# Patient Record
Sex: Female | Born: 1980 | Race: White | Hispanic: No | Marital: Married | Smoking: Current every day smoker
Health system: Southern US, Community
[De-identification: ages and names within clinical notes are randomized; demographics above are authoritative.]

## PROBLEM LIST (undated history)

## (undated) DIAGNOSIS — R21 Rash and other nonspecific skin eruption: Secondary | ICD-10-CM

## (undated) HISTORY — PX: CHOLECYSTECTOMY: SHX55

---

## 2009-10-16 ENCOUNTER — Emergency Department (HOSPITAL_COMMUNITY): Admission: EM | Admit: 2009-10-16 | Discharge: 2009-10-17 | Payer: Self-pay | Admitting: Emergency Medicine

## 2010-11-30 ENCOUNTER — Emergency Department (HOSPITAL_COMMUNITY)
Admission: EM | Admit: 2010-11-30 | Discharge: 2010-11-30 | Disposition: A | Discharge status: Home / Self Care | Payer: Self-pay | Attending: Emergency Medicine | Admitting: Emergency Medicine

## 2010-11-30 ENCOUNTER — Emergency Department (HOSPITAL_COMMUNITY): Payer: Self-pay

## 2010-11-30 DIAGNOSIS — M7989 Other specified soft tissue disorders: Secondary | ICD-10-CM | POA: Insufficient documentation

## 2010-11-30 DIAGNOSIS — M79609 Pain in unspecified limb: Secondary | ICD-10-CM | POA: Insufficient documentation

## 2010-11-30 DIAGNOSIS — L299 Pruritus, unspecified: Secondary | ICD-10-CM | POA: Insufficient documentation

## 2010-12-06 ENCOUNTER — Emergency Department (HOSPITAL_COMMUNITY)
Admission: EM | Admit: 2010-12-06 | Discharge: 2010-12-06 | Disposition: A | Discharge status: Home / Self Care | Payer: Self-pay | Attending: Emergency Medicine | Admitting: Emergency Medicine

## 2010-12-06 DIAGNOSIS — G43909 Migraine, unspecified, not intractable, without status migrainosus: Secondary | ICD-10-CM | POA: Insufficient documentation

## 2010-12-06 DIAGNOSIS — Z8639 Personal history of other endocrine, nutritional and metabolic disease: Secondary | ICD-10-CM | POA: Insufficient documentation

## 2010-12-06 DIAGNOSIS — R109 Unspecified abdominal pain: Secondary | ICD-10-CM | POA: Insufficient documentation

## 2010-12-06 DIAGNOSIS — Z862 Personal history of diseases of the blood and blood-forming organs and certain disorders involving the immune mechanism: Secondary | ICD-10-CM | POA: Insufficient documentation

## 2010-12-06 DIAGNOSIS — R112 Nausea with vomiting, unspecified: Secondary | ICD-10-CM | POA: Insufficient documentation

## 2010-12-06 LAB — URINALYSIS, ROUTINE W REFLEX MICROSCOPIC
Ketones, ur: NEGATIVE mg/dL
Nitrite: NEGATIVE
Protein, ur: NEGATIVE mg/dL

## 2010-12-06 LAB — COMPREHENSIVE METABOLIC PANEL
Alkaline Phosphatase: 42 U/L (ref 39–117)
BUN: 12 mg/dL (ref 6–23)
Chloride: 108 mEq/L (ref 96–112)
Creatinine, Ser: 0.87 mg/dL (ref 0.4–1.2)
Glucose, Bld: 90 mg/dL (ref 70–99)
Potassium: 3.5 mEq/L (ref 3.5–5.1)
Total Bilirubin: 0.4 mg/dL (ref 0.3–1.2)
Total Protein: 5.9 g/dL — ABNORMAL LOW (ref 6.0–8.3)

## 2010-12-06 LAB — PREGNANCY, URINE: Preg Test, Ur: NEGATIVE

## 2012-03-29 ENCOUNTER — Encounter (HOSPITAL_COMMUNITY): Payer: Self-pay | Admitting: *Deleted

## 2012-03-29 ENCOUNTER — Emergency Department (HOSPITAL_COMMUNITY)
Admission: EM | Admit: 2012-03-29 | Discharge: 2012-03-29 | Disposition: A | Discharge status: Home / Self Care | Payer: Self-pay | Attending: Emergency Medicine | Admitting: Emergency Medicine

## 2012-03-29 DIAGNOSIS — B354 Tinea corporis: Secondary | ICD-10-CM

## 2012-03-29 HISTORY — DX: Rash and other nonspecific skin eruption: R21

## 2012-03-29 MED ORDER — GRISEOFULVIN MICROSIZE 500 MG PO TABS: 500.0000 mg | ORAL_TABLET | Freq: Every day | ORAL | Status: DC

## 2012-03-29 NOTE — ED Provider Notes (Signed)
History   This chart was scribed for Gavin Pound. Nathaniel Yaden, MD scribed by Magnus Sinning. The patient was seen in room TR04C/TR04C seen at 16:01   CSN: [id number]  Arrival date & time 03/29/12  1537   First MD Initiated Contact with Patient 03/29/12 1553      Chief Complaint  Patient presents with  . Rash    (Consider location/radiation/quality/duration/timing/severity/associated sxs/prior treatment) HPI Laurie Khan is a 31 y.o. female who presents to the Emergency Department complaining of constant moderate discrete areas of red raised rash located on her chin, behind eyebrow, under breasts, ear, and inner right thigh, onset 7 days. Explains rash located under her both breasts is different from hx of skin infection that the area is now burning and pruritic,and not resolved by steroid cream.She explains she had another rash episode recently and states that it healed with steroid cream. She states it peeled, but returned with the burning itchy rash. She explains her children have had similar rash, but only appeared on daughter's cheeks. Reports she's been using steroid cream with no relief. Denies recent hotel stay, hot tub use, camping, or any pets in her home. Does report recent beach travel much earlier in the month. Hx of eczema  Past Medical History  Diagnosis Date  . Skin rash     under breast ,leg and face    Past Surgical History  Procedure Date  . Appendectomy     History reviewed. No pertinent family history.  History  Substance Use Topics  . Smoking status: Never Smoker   . Smokeless tobacco: Never Used  . Alcohol Use: No    OB History    Grav Para Term Preterm Abortions TAB SAB Ect Mult Living                  Review of Systems  Constitutional: Negative for fever and chills.  Respiratory: Negative for shortness of breath.   Gastrointestinal: Negative for nausea and vomiting.  Skin: Positive for rash. Negative for color change and wound.    Allergies    Review of patient's allergies indicates no known allergies.  Home Medications   Current Outpatient Rx  Name Route Sig Dispense Refill  . GRISEOFULVIN MICROSIZE 500 MG PO TABS Oral Take 1 tablet (500 mg total) by mouth daily. 21 tablet 0    BP 114/98  Pulse 85  Temp 98.6 F (37 C) (Oral)  Resp 21  SpO2 99%  Physical Exam  Nursing note and vitals reviewed. Constitutional: She is oriented to person, place, and time. She appears well-developed and well-nourished. No distress.  HENT:  Head: Normocephalic and atraumatic.  Eyes: EOM are normal.  Neck: Neck supple. No tracheal deviation present.  Cardiovascular: Normal rate.   Pulmonary/Chest: Effort normal. No respiratory distress.  Musculoskeletal: Normal range of motion.  Neurological: She is alert and oriented to person, place, and time.  Skin: Skin is warm and dry. Rash noted. No abrasion, no burn and no ecchymosis noted. Rash is papular. There is erythema. No pallor.       Discrete raise,d pruritic lesions that are papular on inner right thigh, both sides of neck, discrete areas on low back, arms, ankles.  Pt also has patchy erythematous raised pruritic and burning sensation underneath breasts, involving underneath breasts and anterior chest wall.  No drainage, no fluctuance, no induration.    Psychiatric: She has a normal mood and affect. Her behavior is normal.    ED Course  Procedures (including critical  care time) DIAGNOSTIC STUDIES: Oxygen Saturation is 99% on room air, normal by my interpretation.    COORDINATION OF CARE:   Labs Reviewed - No data to display No results found.   1. Tinea corporis       MDM  I personally performed the services described in this documentation, which was scribed in my presence. The recorded information has been reviewed and considered.        Gavin Pound. Oletta Lamas, MD 03/29/12 1610

## 2012-03-29 NOTE — Discharge Instructions (Signed)
Fungus Infection of the Skin An infection of your skin caused by a fungus is a very common problem. Treatment depends on which part of the body is affected. Types of fungal skin infection include:  Athlete's Foot(Tinea pedis). This infection starts between the toes and may involve the entire sole and sides of foot. It is the most common fungal disease. It is made worse by heat, moisture, and friction. To treat, wash your feet 2 to 3 times daily. Dry thoroughly between the toes. Use medicated foot powder or cream as directed on the package. Plain talc, cornstarch, or rice powder may be dusted into socks and shoes to keep the feet dry. Wearing footwear that allows ventilation is also helpful.   Ringworm (Tinea corporis and tinea capitis). This infection causes scaly red rings to form on the skin or scalp. For skin sores, apply medicated lotion or cream as directed on the package. For the scalp, medicated shampoo may be used with with other therapies. Ringworm of the scalp or fingernails usually requires using oral medicine for 2 to 4 months.   Tinea versicolor. This infection appears as painless, scaly, patchy areas of discolored skin (whitish to light [place]). It is more common in the summer and favors oily areas of the skin such as those found at the chest, abdomen, back, pubis, neck, and body folds. It can be treated with medicated shampoo or with medicated topical cream. Oral antifungals may be needed for more active infections. The light and/or dark spots may take time to get better and is not a sign of treatment failure.  Fungal infections may need to be treated for several weeks to be cured. It is important not to treat fungal infections with steroids or combination medicine that contains an antifungal and steroid as these will make the fungal infection worse. SEEK MEDICAL CARE IF:   You have persistent itching or rawness.   You have an oral temperature above 102 F (38.9 C).  Document Released:  10/24/2004 Document Revised: 09/05/2011 Document Reviewed: 01/09/2010 [employer] Patient Information 2012 [employer]. 

## 2012-03-29 NOTE — ED Notes (Signed)
PT reports a red raised rash on upper RT thigh and under each breast. Pt does report a Hx of yeast skin infections.

## 2012-04-02 ENCOUNTER — Emergency Department (HOSPITAL_COMMUNITY)
Admission: EM | Admit: 2012-04-02 | Discharge: 2012-04-02 | Disposition: A | Discharge status: Home / Self Care | Payer: Self-pay | Attending: Emergency Medicine | Admitting: Emergency Medicine

## 2012-04-02 ENCOUNTER — Encounter (HOSPITAL_COMMUNITY): Payer: Self-pay | Admitting: Emergency Medicine

## 2012-04-02 DIAGNOSIS — B86 Scabies: Secondary | ICD-10-CM

## 2012-04-02 DIAGNOSIS — R21 Rash and other nonspecific skin eruption: Secondary | ICD-10-CM | POA: Insufficient documentation

## 2012-04-02 MED ORDER — PERMETHRIN 5 % EX CREA: TOPICAL_CREAM | CUTANEOUS | Status: AC

## 2012-04-02 MED ORDER — BENADRYL 25 MG PO TABS: 25.0000 mg | ORAL_TABLET | Freq: Four times a day (QID) | ORAL | Status: DC | PRN

## 2012-04-02 NOTE — ED Notes (Signed)
PT. REPORTS PROGRESSING GENERALIZED ITCHY RASHES FOR SEVERAL WEEKS , STATES SEEN HERE LAST Sunday PRESCRIBED WITH DIFLUCAN WITH NO IMPROVEMENT.

## 2012-04-02 NOTE — ED Provider Notes (Signed)
History     CSN: [id number]  Arrival date & time 04/02/12  0442   First MD Initiated Contact with Patient 04/02/12 [phone]      Chief Complaint  Patient presents with  . Rash    (Consider location/radiation/quality/duration/timing/severity/associated sxs/prior treatment) Patient is a 31 y.o. female presenting with rash. The history is provided by the patient. No language interpreter was used.  Rash  This is a chronic problem. The current episode started more than 1 week ago. The problem has been gradually worsening. The problem is associated with an unknown factor. There has been no fever. The rash is present on the torso, face, left fingers and right fingers. The pain is at a severity of 3/10. The pain is mild. The pain has been constant since onset. Associated symptoms include itching and pain. She has tried anti-itch cream for the symptoms. Improvement on treatment: griseofulvin 500mg  po.  Pruritic Rash since 6/23.  Second visit to the ER.  She was treated with griseofulvin and diflucan po with no relief.  Husband and baby also have the rash but not as bad.  Red raised  and linear in nature to some areas especially between fingers. No fluctuance or infection/cellulitis noted.      Past Medical History  Diagnosis Date  . Skin rash     under breast ,leg and face    Past Surgical History  Procedure Date  . Appendectomy     No family history on file.  History  Substance Use Topics  . Smoking status: Never Smoker   . Smokeless tobacco: Never Used  . Alcohol Use: No    OB History    Grav Para Term Preterm Abortions TAB SAB Ect Mult Living                  Review of Systems  Constitutional: Negative.   HENT: Negative.  Negative for sore throat and neck stiffness.   Eyes: Negative.   Respiratory: Negative.  Negative for shortness of breath.   Cardiovascular: Negative.   Gastrointestinal: Negative.  Negative for nausea and vomiting.  Genitourinary: Negative for hematuria.   Skin: Positive for itching and rash.  Neurological: Negative.   Psychiatric/Behavioral: Negative.   All other systems reviewed and are negative.    Allergies  Review of patient's allergies indicates no known allergies.  Home Medications  No current outpatient prescriptions on file.  BP 122/72  Pulse 87  Temp 98.4 F (36.9 C) (Oral)  Resp 14  SpO2 99%  Physical Exam  Nursing note and vitals reviewed. Constitutional: She is oriented to person, place, and time. She appears well-developed and well-nourished.  HENT:  Head: Normocephalic and atraumatic.  Eyes: Conjunctivae and EOM are normal. Pupils are equal, round, and reactive to light.  Neck: Normal range of motion. Neck supple.  Cardiovascular: Normal rate.   Pulmonary/Chest: Effort normal.  Abdominal: Soft.  Musculoskeletal: Normal range of motion. She exhibits no edema and no tenderness.  Neurological: She is alert and oriented to person, place, and time. She has normal reflexes.  Skin: Skin is warm and dry. Rash noted.  Psychiatric: She has a normal mood and affect.    ED Course  Procedures (including critical care time)  Labs Reviewed - No data to display No results found.   No diagnosis found.    MDM  Red raised pruritic  linear rash.  Treated with griseofulvin and diflucan recently with no relief.  Will treat for scabies today because linear in nature  and the familiy has it too. Husband has been staying in motels with travel. Could be bed bugs.  Benadryl for itching.  No infection noted.  Will give dermatology referral if not better to Dr. Margo Aye.           Remi Haggard, NP 04/03/12 2152

## 2012-04-02 NOTE — ED Notes (Signed)
PT. SEEN AND EXAMINED BY A. CRAWFORD NP.

## 2012-04-13 NOTE — ED Provider Notes (Signed)
History/physical exam/procedure(s) were performed by non-physician practitioner and as supervising physician I was immediately available for consultation/collaboration. I have reviewed all notes and am in agreement with care and plan.   Jadon Ressler S Kylyn Sookram, MD 04/13/12 0718 

## 2012-08-11 ENCOUNTER — Emergency Department (HOSPITAL_COMMUNITY)
Admission: EM | Admit: 2012-08-11 | Discharge: 2012-08-12 | Disposition: A | Discharge status: Home / Self Care | Payer: Self-pay | Attending: Emergency Medicine | Admitting: Emergency Medicine

## 2012-08-11 ENCOUNTER — Encounter (HOSPITAL_COMMUNITY): Payer: Self-pay | Admitting: Emergency Medicine

## 2012-08-11 DIAGNOSIS — F17200 Nicotine dependence, unspecified, uncomplicated: Secondary | ICD-10-CM | POA: Insufficient documentation

## 2012-08-11 DIAGNOSIS — R109 Unspecified abdominal pain: Secondary | ICD-10-CM

## 2012-08-11 DIAGNOSIS — R110 Nausea: Secondary | ICD-10-CM | POA: Insufficient documentation

## 2012-08-11 DIAGNOSIS — R1031 Right lower quadrant pain: Secondary | ICD-10-CM | POA: Insufficient documentation

## 2012-08-11 LAB — WET PREP, GENITAL
Trich, Wet Prep: NONE SEEN
Yeast Wet Prep HPF POC: NONE SEEN

## 2012-08-11 LAB — CBC WITH DIFFERENTIAL/PLATELET
Basophils Relative: 1 % (ref 0–1)
Eosinophils Absolute: 0.2 10*3/uL (ref 0.0–0.7)
HCT: 40.9 % (ref 36.0–46.0)
Hemoglobin: 14.2 g/dL (ref 12.0–15.0)
MCH: 30 pg (ref 26.0–34.0)
MCHC: 34.7 g/dL (ref 30.0–36.0)
MCV: 86.3 fL (ref 78.0–100.0)
Monocytes Absolute: 0.5 10*3/uL (ref 0.1–1.0)
Monocytes Relative: 8 % (ref 3–12)

## 2012-08-11 LAB — URINALYSIS, ROUTINE W REFLEX MICROSCOPIC
Bilirubin Urine: NEGATIVE
Hgb urine dipstick: NEGATIVE
Specific Gravity, Urine: 1.021 (ref 1.005–1.030)
Urobilinogen, UA: 0.2 mg/dL (ref 0.0–1.0)

## 2012-08-11 LAB — URINE MICROSCOPIC-ADD ON

## 2012-08-11 LAB — BASIC METABOLIC PANEL
BUN: 7 mg/dL (ref 6–23)
Chloride: 103 mEq/L (ref 96–112)
Creatinine, Ser: 0.82 mg/dL (ref 0.50–1.10)
GFR calc Af Amer: >90 mL/min (ref >90)
GFR calc non Af Amer: >90 mL/min (ref >90)

## 2012-08-11 MED ORDER — IOHEXOL 300 MG/ML  SOLN
20.0000 mL | INTRAMUSCULAR | Status: AC
  Administered 2012-08-11: 20 mL via ORAL

## 2012-08-11 NOTE — ED Provider Notes (Signed)
History     CSN: [id number]  Arrival date & time 08/11/12  1500   First MD Initiated Contact with Patient 08/11/12 1939      Chief Complaint  Patient presents with  . Abdominal Pain  . Nausea    (Consider location/radiation/quality/duration/timing/severity/associated sxs/prior treatment) HPI Comments: Patient presents today with a chief complaint of abdominal pain.  Pain predominantly located in the RLQ.  She reports that she began having generalized abdominal pain yesterday, but today reports that it is predominantly in the RLQ.  She reports that she has never had pain like this before.  Pain came on gradually and is gradually worsening.  She has not taken anything for pain.  She denies fever.  She has had chills  She had nausea yesterday, but nausea is controlled at this time.  No vomiting.  Denies diarrhea.  Denies constipation.  Denies vaginal discharge or abnormal vaginal bleeding.  She denies increased urinary frequency, urgency, or dysuria.    The history is provided by the patient.    Past Medical History  Diagnosis Date  . Skin rash     under breast ,leg and face    Past Surgical History  Procedure Date  . Cholecystectomy     History reviewed. No pertinent family history.  History  Substance Use Topics  . Smoking status: Current Every Day Smoker  . Smokeless tobacco: Never Used  . Alcohol Use: Yes     Comment: occ    OB History    Grav Para Term Preterm Abortions TAB SAB Ect Mult Living                  Review of Systems  Constitutional: Positive for chills and appetite change. Negative for fever.  Gastrointestinal: Positive for nausea and abdominal pain. Negative for vomiting, diarrhea, constipation, blood in stool and abdominal distention.  Genitourinary: Negative for dysuria, urgency, frequency, vaginal bleeding and vaginal discharge.    Allergies  Review of patient's allergies indicates no known allergies.  Home Medications  No current  outpatient prescriptions on file.  BP 110/66  Pulse 71  Temp 97.9 F (36.6 C) (Oral)  Resp 16  SpO2 100%  Physical Exam  Nursing note and vitals reviewed. Constitutional: She appears well-developed and well-nourished. No distress.  HENT:  Head: Normocephalic and atraumatic.  Mouth/Throat: Oropharynx is clear and moist.  Neck: Normal range of motion. Neck supple.  Cardiovascular: Normal rate, regular rhythm and normal heart sounds.   Pulmonary/Chest: Effort normal.  Abdominal: Soft. Bowel sounds are normal. She exhibits no distension and no mass. There is tenderness in the right lower quadrant. There is rebound and guarding.  Genitourinary: Cervix exhibits no motion tenderness. Right adnexum displays no mass, no tenderness and no fullness. Left adnexum displays no mass, no tenderness and no fullness.  Neurological: She is alert.  Skin: Skin is warm and dry. She is not diaphoretic.  Psychiatric: She has a normal mood and affect.    ED Course  Procedures (including critical care time)  Labs Reviewed  BASIC METABOLIC PANEL - Abnormal; Notable for the following:    Potassium 3.4 (*)     All other components within normal limits  URINALYSIS, ROUTINE W REFLEX MICROSCOPIC - Abnormal; Notable for the following:    APPearance HAZY (*)     Leukocytes, UA MODERATE (*)     All other components within normal limits  URINE MICROSCOPIC-ADD ON - Abnormal; Notable for the following:    Squamous Epithelial / LPF  FEW (*)     Bacteria, UA FEW (*)     All other components within normal limits  CBC WITH DIFFERENTIAL  LIPASE, BLOOD  POCT PREGNANCY, URINE  URINE CULTURE  GC/CHLAMYDIA PROBE AMP  WET PREP, GENITAL   No results found.   No diagnosis found.  12:37 AM Patient signed out to Arthor Captain, PA-C who assumes care of patient.  CT pending.  MDM  Patient presenting with a chief complaint of abdominal pain that has been present.  On abdominal exam the pain is localized to the RLQ.   Rebound and guarding present.  No adnexal tenderness on pelvic exam.  Therefore, CT ab/pelvis ordered to rule out Acute Appendicitis.  Arthor Captain, PA-C will follow up on the results of the CT.         Pascal Lux [place], PA-C 08/12/12 0139

## 2012-08-11 NOTE — ED Notes (Signed)
Pt

## 2012-08-11 NOTE — ED Notes (Addendum)
Pt reports abdominal pain, chills, nausea, body aches and cramping since approx 3pm yesterday. States sharp abdominal pain started today. Reports decreased appetite. Complains of diarrhea x 1 day. Denies pain recent change in diet.

## 2012-08-11 NOTE — ED Notes (Signed)
Pt completed oral contrast drink.

## 2012-08-11 NOTE — ED Notes (Signed)
Pt c/o right sided abd pain with nausea starting yesterday; pt sts episode with some chills with pain yesterday; pt sts tender to palpation

## 2012-08-12 ENCOUNTER — Emergency Department (HOSPITAL_COMMUNITY): Payer: Self-pay

## 2012-08-12 ENCOUNTER — Encounter (HOSPITAL_COMMUNITY): Payer: Self-pay | Admitting: Radiology

## 2012-08-12 MED ORDER — IOHEXOL 300 MG/ML  SOLN
100.0000 mL | Freq: Once | INTRAMUSCULAR | Status: AC | PRN
  Administered 2012-08-12: 100 mL via INTRAVENOUS

## 2012-08-12 MED ORDER — DICYCLOMINE HCL 20 MG PO TABS: 20.0000 mg | ORAL_TABLET | Freq: Two times a day (BID) | ORAL | Status: DC

## 2012-08-12 MED ORDER — METRONIDAZOLE 500 MG PO TABS: 500.0000 mg | ORAL_TABLET | Freq: Two times a day (BID) | ORAL | Status: DC

## 2012-08-12 NOTE — ED Notes (Signed)
Pt A.O. X 4. NAD. Ambulatory. Respirations even and regular. Vitals stable. Skin warm and dry. States pain is unchanged since arrival . Rates abdominal pain as 8/10. Denies N/V/D/C. Verbalized understanding of medication admin. No further questions at this time.

## 2012-08-12 NOTE — ED Provider Notes (Signed)
Medical screening examination/treatment/procedure(s) were performed by non-physician practitioner and as supervising physician I was immediately available for consultation/collaboration.   Laray Anger, DO 08/12/12 1325

## 2012-08-12 NOTE — ED Provider Notes (Signed)
Medical screening examination/treatment/procedure(s) were performed by non-physician practitioner and as supervising physician I was immediately available for consultation/collaboration.   Charles B. Sheldon, MD 08/12/12 1950 

## 2012-08-12 NOTE — ED Provider Notes (Signed)
12:45 AM Assumed care of the patient from [employer], PA-C. Patient Labs show BV.  Questionable UTI. Patient without ruinary sxs.Abdominal pain still present.  4/10.  Will d/c with bentyl and ob gyn fu. CV: RRR, No M/R/G, Peripheral pulses intact. No peripheral edema. Lungs: CTAB Abd: Soft,  Mild difuse tenderness., non distended   Discussed reasons to seek immediate care. Patient expresses understanding and agrees with plan.    Arthor Captain, PA-C 08/12/12 0120

## 2012-08-12 NOTE — ED Notes (Signed)
Pt returned from CT °

## 2012-08-12 NOTE — ED Notes (Signed)
Patient transported to CT 

## 2012-08-15 LAB — URINE CULTURE

## 2012-08-16 NOTE — ED Notes (Signed)
+   Urine Chart sent to EDP office for review. 

## 2012-08-18 NOTE — ED Notes (Signed)
Chart returned from EDP office rx for Levoquin 500 mg 1 day x 5 days written by A.Crawford need to be called to pharmacy  .

## 2013-03-04 ENCOUNTER — Encounter (HOSPITAL_COMMUNITY): Payer: Self-pay | Admitting: Emergency Medicine

## 2013-03-04 ENCOUNTER — Emergency Department (HOSPITAL_COMMUNITY)
Admission: EM | Admit: 2013-03-04 | Discharge: 2013-03-04 | Disposition: A | Discharge status: Home / Self Care | Payer: Self-pay | Attending: Emergency Medicine | Admitting: Emergency Medicine

## 2013-03-04 DIAGNOSIS — Z872 Personal history of diseases of the skin and subcutaneous tissue: Secondary | ICD-10-CM | POA: Insufficient documentation

## 2013-03-04 DIAGNOSIS — F17200 Nicotine dependence, unspecified, uncomplicated: Secondary | ICD-10-CM | POA: Insufficient documentation

## 2013-03-04 DIAGNOSIS — J40 Bronchitis, not specified as acute or chronic: Secondary | ICD-10-CM | POA: Insufficient documentation

## 2013-03-04 DIAGNOSIS — R062 Wheezing: Secondary | ICD-10-CM | POA: Insufficient documentation

## 2013-03-04 DIAGNOSIS — H6690 Otitis media, unspecified, unspecified ear: Secondary | ICD-10-CM | POA: Insufficient documentation

## 2013-03-04 MED ORDER — AMOXICILLIN 500 MG PO CAPS: 500.0000 mg | ORAL_CAPSULE | Freq: Three times a day (TID) | ORAL | Status: AC

## 2013-03-04 MED ORDER — ALBUTEROL SULFATE HFA 108 (90 BASE) MCG/ACT IN AERS
2.0000 | INHALATION_SPRAY | RESPIRATORY_TRACT | Status: DC | PRN
  Administered 2013-03-04: 2 via RESPIRATORY_TRACT
  Filled 2013-03-04: qty 6.7

## 2013-03-04 MED ORDER — BENZONATATE 100 MG PO CAPS: 100.0000 mg | ORAL_CAPSULE | Freq: Three times a day (TID) | ORAL | Status: AC

## 2013-03-04 NOTE — ED Provider Notes (Signed)
History    This chart was scribed for Rhea Bleacher, PA working with Shelda Jakes, MD by ED Scribe, Burman Nieves. This patient was seen in room TR11C/TR11C and the patient's care was started at 7:19 PM.   CSN: [id number]  Arrival date & time 03/04/13  1831   First MD Initiated Contact with Patient 03/04/13 1919      Chief Complaint  Patient presents with  . URI  . Otalgia    (Consider location/radiation/quality/duration/timing/severity/associated sxs/prior treatment) Patient is a 32 y.o. female presenting with URI and ear pain. The history is provided by the patient and a friend. No language interpreter was used.  URI Presenting symptoms: congestion, cough and ear pain   Presenting symptoms: no fever   Congestion:    Location:  Chest and nasal   Interferes with sleep: yes   Cough:    Cough characteristics:  Productive   Sputum characteristics:  Green   Severity:  Moderate   Timing:  Intermittent Severity:  Moderate Timing:  Constant Progression:  Unchanged Relieved by:  Nothing Associated symptoms: wheezing   Otalgia Associated symptoms: congestion and cough   Associated symptoms: no diarrhea, no fever and no vomiting    HPI Comments: Laurie Khan is a 32 y.o. female who presents to the Emergency Department complaining of a moderate intermittent cough with associated congestion which has been going on for the past 2 weeks and moderate constant otalgia which started Tuesday (06/03). She states that sx's started off with a productive cough with green sputum. Tuesday she started to experience otalgia (left ear worse than the right). She complains of not being able to hear out of her left ear like there is something "rattling around in there". Boyfriend reports that he can hear pt wheezing during her sleep. Pt denies fever, chills, nausea, vomiting, diarrhea, SOB, and any other associated symptoms. Pt is a current everyday tobacco smoker and admits to drinking alcohol.     Past Medical History  Diagnosis Date  . Skin rash     under breast ,leg and face    Past Surgical History  Procedure Laterality Date  . Cholecystectomy      History reviewed. No pertinent family history.  History  Substance Use Topics  . Smoking status: Current Every Day Smoker  . Smokeless tobacco: Never Used  . Alcohol Use: Yes     Comment: occ    OB History   Grav Para Term Preterm Abortions TAB SAB Ect Mult Living                  Review of Systems  Constitutional: Negative for fever and chills.  HENT: Positive for ear pain and congestion.   Respiratory: Positive for cough and wheezing. Negative for shortness of breath.   Gastrointestinal: Negative for vomiting, diarrhea and constipation.    Allergies  Review of patient's allergies indicates no known allergies.  Home Medications   Current Outpatient Rx  Name  Route  Sig  Dispense  Refill  . amoxicillin (AMOXIL) 500 MG capsule   Oral   Take 1 capsule (500 mg total) by mouth 3 (three) times daily.   21 capsule   0   . benzonatate (TESSALON) 100 MG capsule   Oral   Take 1 capsule (100 mg total) by mouth every 8 (eight) hours.   15 capsule   0     BP 109/74  Pulse 88  Temp(Src) 99.1 F (37.3 C) (Oral)  Resp 16  SpO2  97%  Physical Exam  Nursing note and vitals reviewed. Constitutional: She appears well-developed and well-nourished. No distress.  Coughing excessively during PE.   HENT:  Head: Normocephalic and atraumatic.  Right Ear: Tympanic membrane is erythematous.  Left Ear: Tympanic membrane is erythematous.  Eyes: Conjunctivae and EOM are normal. Pupils are equal, round, and reactive to light.  Neck: Normal range of motion. Neck supple. No tracheal deviation present.  Cardiovascular: Normal rate, regular rhythm, normal heart sounds and intact distal pulses.   No murmur heard. Pulmonary/Chest: Effort normal. No respiratory distress.  Abdominal: Soft. There is no tenderness. There is  no rebound.  Musculoskeletal: Normal range of motion.  Neurological: She is alert.  Skin: Skin is warm and dry.  Psychiatric: She has a normal mood and affect. Her behavior is normal.    ED Course  Procedures (including critical care time) DIAGNOSTIC STUDIES: Oxygen Saturation is 97% on room air, adequate by my interpretation.    COORDINATION OF CARE:  8:07 PM Discussed ED treatment with pt and pt agrees.    Labs Reviewed - No data to display No results found.   1. Bronchitis   2. Otitis media, bilateral    Patient seen and examined.   Vital signs reviewed and are as follows: Filed Vitals:   03/04/13 1841  BP: 109/74  Pulse: 88  Temp: 99.1 F (37.3 C)  Resp: 16   Patient counseled on use of albuterol HFA.  Told to use 1-2 puffs q 4 hours as needed for SOB.  Patient urged to return with worsening symptoms or other concerns. Patient verbalized understanding and agrees with plan.     MDM  Ear pain: pt appears to have bilateral otitis media. Amox rx.   Cough: post-viral bronchitis. Symptomatic relief including inhaler given.       I personally performed the services described in this documentation, which was scribed in my presence. The recorded information has been reviewed and is accurate.     Renne Crigler, PA-C 03/06/13 [phone]

## 2013-03-04 NOTE — ED Notes (Signed)
Pt discharged.Vital signs stable and GCS 15 

## 2013-03-04 NOTE — ED Notes (Signed)
Pt c/o non productive cough and bilateral ear pain x 2 weeks worse over last couple of days

## 2013-03-11 NOTE — ED Provider Notes (Signed)
Medical screening examination/treatment/procedure(s) were performed by non-physician practitioner and as supervising physician I was immediately available for consultation/collaboration.   Maddyn Lieurance W. Demetrious Rainford, MD 03/11/13 1213 

## 2015-05-14 ENCOUNTER — Encounter (HOSPITAL_COMMUNITY): Payer: Self-pay | Admitting: Emergency Medicine

## 2015-05-14 ENCOUNTER — Emergency Department (HOSPITAL_COMMUNITY): Payer: Medicaid Other

## 2015-05-14 ENCOUNTER — Emergency Department (HOSPITAL_COMMUNITY)
Admission: EM | Admit: 2015-05-14 | Discharge: 2015-05-14 | Disposition: A | Discharge status: Home / Self Care | Payer: Medicaid Other | Attending: Emergency Medicine | Admitting: Emergency Medicine

## 2015-05-14 DIAGNOSIS — Z720 Tobacco use: Secondary | ICD-10-CM | POA: Diagnosis not present

## 2015-05-14 DIAGNOSIS — Y939 Activity, unspecified: Secondary | ICD-10-CM | POA: Diagnosis not present

## 2015-05-14 DIAGNOSIS — S93401A Sprain of unspecified ligament of right ankle, initial encounter: Secondary | ICD-10-CM | POA: Diagnosis not present

## 2015-05-14 DIAGNOSIS — X58XXXA Exposure to other specified factors, initial encounter: Secondary | ICD-10-CM | POA: Insufficient documentation

## 2015-05-14 DIAGNOSIS — Y9289 Other specified places as the place of occurrence of the external cause: Secondary | ICD-10-CM | POA: Diagnosis not present

## 2015-05-14 DIAGNOSIS — Y998 Other external cause status: Secondary | ICD-10-CM | POA: Insufficient documentation

## 2015-05-14 DIAGNOSIS — Z792 Long term (current) use of antibiotics: Secondary | ICD-10-CM | POA: Diagnosis not present

## 2015-05-14 DIAGNOSIS — S99911A Unspecified injury of right ankle, initial encounter: Secondary | ICD-10-CM | POA: Diagnosis present

## 2015-05-14 DIAGNOSIS — Z79899 Other long term (current) drug therapy: Secondary | ICD-10-CM | POA: Diagnosis not present

## 2015-05-14 NOTE — ED Notes (Signed)
Pt states she stepped in a hole near her front door and "rolled" her R ankle.  States she heard a pop and is having dull pain.  CMS intact.

## 2015-05-14 NOTE — Discharge Instructions (Signed)
Ankle Sprain °An ankle sprain is an injury to the strong, fibrous tissues (ligaments) that hold the bones of your ankle joint together.  °CAUSES °An ankle sprain is usually caused by a fall or by twisting your ankle. Ankle sprains most commonly occur when you step on the outer edge of your foot, and your ankle turns inward. People who participate in sports are more prone to these types of injuries.  °SYMPTOMS  °· Pain in your ankle. The pain may be present at rest or only when you are trying to stand or walk. °· Swelling. °· Bruising. Bruising may develop immediately or within 1 to 2 days after your injury. °· Difficulty standing or walking, particularly when turning corners or changing directions. °DIAGNOSIS  °Your caregiver will ask you details about your injury and perform a physical exam of your ankle to determine if you have an ankle sprain. During the physical exam, your caregiver will press on and apply pressure to specific areas of your foot and ankle. Your caregiver will try to move your ankle in certain ways. An X-ray exam may be done to be sure a bone was not broken or a ligament did not separate from one of the bones in your ankle (avulsion fracture).  °TREATMENT  °Certain types of braces can help stabilize your ankle. Your caregiver can make a recommendation for this. Your caregiver may recommend the use of medicine for pain. If your sprain is severe, your caregiver may refer you to a surgeon who helps to restore function to parts of your skeletal system (orthopedist) or a physical therapist. °HOME CARE INSTRUCTIONS  °· Apply ice to your injury for 1-2 days or as directed by your caregiver. Applying ice helps to reduce inflammation and pain. °¨ Put ice in a plastic bag. °¨ Place a towel between your skin and the bag. °¨ Leave the ice on for 15-20 minutes at a time, every 2 hours while you are awake. °· Only take over-the-counter or prescription medicines for pain, discomfort, or fever as directed by  your caregiver. °· Elevate your injured ankle above the level of your heart as much as possible for 2-3 days. °· If your caregiver recommends crutches, use them as instructed. Gradually put weight on the affected ankle. Continue to use crutches or a cane until you can walk without feeling pain in your ankle. °· If you have a plaster splint, wear the splint as directed by your caregiver. Do not rest it on anything harder than a pillow for the first 24 hours. Do not put weight on it. Do not get it wet. You may take it off to take a shower or bath. °· You may have been given an elastic bandage to wear around your ankle to provide support. If the elastic bandage is too tight (you have numbness or tingling in your foot or your foot becomes cold and blue), adjust the bandage to make it comfortable. °· If you have an air splint, you may blow more air into it or let air out to make it more comfortable. You may take your splint off at night and before taking a shower or bath. Wiggle your toes in the splint several times per day to decrease swelling. °SEEK MEDICAL CARE IF:  °· You have rapidly increasing bruising or swelling. °· Your toes feel extremely cold or you lose feeling in your foot. °· Your pain is not relieved with medicine. °SEEK IMMEDIATE MEDICAL CARE IF: °· Your toes are numb or blue. °·   You have severe pain that is increasing. MAKE SURE YOU:   Understand these instructions.  Will watch your condition.  Will get help right away if you are not doing well or get worse. Document Released: 09/16/2005 Document Revised: 06/10/2012 Document Reviewed: 09/28/2011 [facility] Patient Information 2015 [employer], [place]. This information is not intended to replace advice given to you by your health care provider. Make sure you discuss any questions you have with your health care provider.  Ankle Sprain An ankle sprain is an injury to the strong, fibrous tissues (ligaments) that hold the bones of your ankle joint  together.  CAUSES An ankle sprain is usually caused by a fall or by twisting your ankle. Ankle sprains most commonly occur when you step on the outer edge of your foot, and your ankle turns inward. People who participate in sports are more prone to these types of injuries.  SYMPTOMS   Pain in your ankle. The pain may be present at rest or only when you are trying to stand or walk.  Swelling.  Bruising. Bruising may develop immediately or within 1 to 2 days after your injury.  Difficulty standing or walking, particularly when turning corners or changing directions. DIAGNOSIS  Your caregiver will ask you details about your injury and perform a physical exam of your ankle to determine if you have an ankle sprain. During the physical exam, your caregiver will press on and apply pressure to specific areas of your foot and ankle. Your caregiver will try to move your ankle in certain ways. An X-ray exam may be done to be sure a bone was not broken or a ligament did not separate from one of the bones in your ankle (avulsion fracture).  TREATMENT  Certain types of braces can help stabilize your ankle. Your caregiver can make a recommendation for this. Your caregiver may recommend the use of medicine for pain. If your sprain is severe, your caregiver may refer you to a surgeon who helps to restore function to parts of your skeletal system (orthopedist) or a physical therapist. HOME CARE INSTRUCTIONS   Apply ice to your injury for 1-2 days or as directed by your caregiver. Applying ice helps to reduce inflammation and pain.  Put ice in a plastic bag.  Place a towel between your skin and the bag.  Leave the ice on for 15-20 minutes at a time, every 2 hours while you are awake.  Only take over-the-counter or prescription medicines for pain, discomfort, or fever as directed by your caregiver.  Elevate your injured ankle above the level of your heart as much as possible for 2-3 days.  If your  caregiver recommends crutches, use them as instructed. Gradually put weight on the affected ankle. Continue to use crutches or a cane until you can walk without feeling pain in your ankle.  If you have a plaster splint, wear the splint as directed by your caregiver. Do not rest it on anything harder than a pillow for the first 24 hours. Do not put weight on it. Do not get it wet. You may take it off to take a shower or bath.  You may have been given an elastic bandage to wear around your ankle to provide support. If the elastic bandage is too tight (you have numbness or tingling in your foot or your foot becomes cold and blue), adjust the bandage to make it comfortable.  If you have an air splint, you may blow more air into it or let  air out to make it more comfortable. You may take your splint off at night and before taking a shower or bath. Wiggle your toes in the splint several times per day to decrease swelling. °SEEK MEDICAL CARE IF:  °· You have rapidly increasing bruising or swelling. °· Your toes feel extremely cold or you lose feeling in your foot. °· Your pain is not relieved with medicine. °SEEK IMMEDIATE MEDICAL CARE IF: °· Your toes are numb or blue. °· You have severe pain that is increasing. °MAKE SURE YOU:  °· Understand these instructions. °· Will watch your condition. °· Will get help right away if you are not doing well or get worse. °Document Released: 09/16/2005 Document Revised: 06/10/2012 Document Reviewed: 09/28/2011 °[employer]® Patient Information ©2015 [employer]. This information is not intended to replace advice given to you by your health care provider. Make sure you discuss any questions you have with your health care provider. ° °

## 2015-05-14 NOTE — ED Notes (Signed)
Pt c/o R ankle pain onset today; reports stepping into a hole today, "rolled" her ankle and heard a pop; able to ambulate with mild discomfort; reports pain worsens when not applying pressure

## 2015-05-14 NOTE — ED Provider Notes (Signed)
CSN: [id number]     Arrival date & time 05/14/15  2051 History  This chart was scribed for non-physician practitioner, Lonia Skinner. Laurie Khan, working with Rolland Porter, MD by Marica Otter, ED Scribe. This patient was seen in room TR07C/TR07C and the patient's care was started at 10:04 PM.   Chief Complaint  Patient presents with  . Ankle Pain   The history is provided by the patient. No language interpreter was used.   PCP: Default, Provider, MD HPI Comments: Laurie Khan is a 34 y.o. female, with PMHx noted below, who presents to the Emergency Department complaining of traumatic, sudden onset, dull, 7/10 right ankle pain onset today after pt stepped on a hole, rolled her ankle and heard a couple of pops. Pt notes the pain is worse with pressure. Pt reports she is able to ambulate; pt states she "can hear the popping and clicking" in her ankle while walking. Pt notes she is able to move her toes.   Past Medical History  Diagnosis Date  . Skin rash     under breast ,leg and face   Past Surgical History  Procedure Laterality Date  . Cholecystectomy     No family history on file. Social History  Substance Use Topics  . Smoking status: Current Every Day Smoker  . Smokeless tobacco: Never Used  . Alcohol Use: Yes     Comment: occ   OB History    No data available     Review of Systems  Constitutional: Negative for fever and chills.  Musculoskeletal: Positive for arthralgias (right ankle pain). Negative for gait problem.  All other systems reviewed and are negative.  Allergies  Review of patient's allergies indicates no known allergies.   Home Medications   Prior to Admission medications   Medication Sig Start Date End Date Taking? Authorizing Provider  amoxicillin (AMOXIL) 500 MG capsule Take 1 capsule (500 mg total) by mouth 3 (three) times daily. 03/04/13   Renne Crigler, PA-C  benzonatate (TESSALON) 100 MG capsule Take 1 capsule (100 mg total) by mouth every 8 (eight)  hours. 03/04/13   Renne Crigler, PA-C   Triage Vitals: BP 116/67 mmHg  Pulse 70  Temp(Src) 98.1 F (36.7 C) (Oral)  Resp 16  Ht  (1.6 m)  Wt 160 lb 3.2 oz (72.666 kg)  BMI 28.39 kg/m2  SpO2 95%  LMP 05/12/2015 (Exact Date) Physical Exam  Constitutional: She is oriented to person, place, and time. She appears well-developed and well-nourished. No distress.  HENT:  Head: Normocephalic and atraumatic.  Eyes: Conjunctivae and EOM are normal.  Cardiovascular: Normal rate.   Pulmonary/Chest: Effort normal. No respiratory distress.  Musculoskeletal: Normal range of motion.  Swollen lateral malleolus with FROM.   Neurological: She is alert and oriented to person, place, and time.  Skin: Skin is warm and dry.  Psychiatric: She has a normal mood and affect. Her behavior is normal.  Nursing note and vitals reviewed.   ED Course  Procedures (including critical care time) DIAGNOSTIC STUDIES: Oxygen Saturation is 95% on RA, adequate by my interpretation.    COORDINATION OF CARE: 10:08 PM: Discussed treatment plan which includes ankle brace, use of crutches, not putting any weight on right ankle for three days, and ortho referral with pt at bedside; patient verbalizes understanding and agrees with treatment plan.   Labs Review Labs Reviewed - No data to display  Imaging Review Dg Ankle Complete Right  05/14/2015   CLINICAL DATA:  Recent  fall with twisting injury to the ankle, initial encounter  EXAM: RIGHT ANKLE - COMPLETE 3+ VIEW  COMPARISON:  None.  FINDINGS: There is no evidence of fracture, dislocation, or joint effusion. There is no evidence of arthropathy or other focal bone abnormality. Soft tissues are unremarkable.  IMPRESSION: No acute abnormality noted.   Electronically Signed   By: Alcide Clever M.D.   On: 05/14/2015 21:44      EKG Interpretation None      MDM   Final diagnoses:  Ankle sprain, right, initial encounter     I personally performed the services in  this documentation, which was scribed in my presence.  The recorded information has been reviewed and considered.   Barnet Pall.  Lonia Skinner [place], PA-C 05/15/15 1478  Rolland Porter, MD 05/19/15 [phone]

## 2015-05-28 ENCOUNTER — Emergency Department (HOSPITAL_COMMUNITY)
Admission: EM | Admit: 2015-05-28 | Discharge: 2015-05-28 | Disposition: A | Discharge status: Home / Self Care | Payer: Medicaid Other | Attending: Emergency Medicine | Admitting: Emergency Medicine

## 2015-05-28 ENCOUNTER — Encounter (HOSPITAL_COMMUNITY): Payer: Self-pay | Admitting: *Deleted

## 2015-05-28 ENCOUNTER — Emergency Department (HOSPITAL_COMMUNITY): Payer: Medicaid Other

## 2015-05-28 DIAGNOSIS — Z720 Tobacco use: Secondary | ICD-10-CM | POA: Diagnosis not present

## 2015-05-28 DIAGNOSIS — X58XXXD Exposure to other specified factors, subsequent encounter: Secondary | ICD-10-CM | POA: Insufficient documentation

## 2015-05-28 DIAGNOSIS — M25571 Pain in right ankle and joints of right foot: Secondary | ICD-10-CM

## 2015-05-28 DIAGNOSIS — Z792 Long term (current) use of antibiotics: Secondary | ICD-10-CM | POA: Diagnosis not present

## 2015-05-28 DIAGNOSIS — Z79899 Other long term (current) drug therapy: Secondary | ICD-10-CM | POA: Diagnosis not present

## 2015-05-28 DIAGNOSIS — S93401D Sprain of unspecified ligament of right ankle, subsequent encounter: Secondary | ICD-10-CM | POA: Diagnosis not present

## 2015-05-28 NOTE — Discharge Instructions (Signed)
Read the information below.  You may return to the Emergency Department at any time for worsening condition or any new symptoms that concern you.  If you develop uncontrolled pain, weakness or numbness of the extremity, severe discoloration of the skin, or you are unable to walk or move your foot, return to the ER for a recheck.     Ankle Sprain An ankle sprain is an injury to the strong, fibrous tissues (ligaments) that hold the bones of your ankle joint together.  CAUSES An ankle sprain is usually caused by a fall or by twisting your ankle. Ankle sprains most commonly occur when you step on the outer edge of your foot, and your ankle turns inward. People who participate in sports are more prone to these types of injuries.  SYMPTOMS   Pain in your ankle. The pain may be present at rest or only when you are trying to stand or walk.  Swelling.  Bruising. Bruising may develop immediately or within 1 to 2 days after your injury.  Difficulty standing or walking, particularly when turning corners or changing directions. DIAGNOSIS  Your caregiver will ask you details about your injury and perform a physical exam of your ankle to determine if you have an ankle sprain. During the physical exam, your caregiver will press on and apply pressure to specific areas of your foot and ankle. Your caregiver will try to move your ankle in certain ways. An X-ray exam may be done to be sure a bone was not broken or a ligament did not separate from one of the bones in your ankle (avulsion fracture).  TREATMENT  Certain types of braces can help stabilize your ankle. Your caregiver can make a recommendation for this. Your caregiver may recommend the use of medicine for pain. If your sprain is severe, your caregiver may refer you to a surgeon who helps to restore function to parts of your skeletal system (orthopedist) or a physical therapist. HOME CARE INSTRUCTIONS   Apply ice to your injury for 1-2 days or as directed  by your caregiver. Applying ice helps to reduce inflammation and pain.  Put ice in a plastic bag.  Place a towel between your skin and the bag.  Leave the ice on for 15-20 minutes at a time, every 2 hours while you are awake.  Only take over-the-counter or prescription medicines for pain, discomfort, or fever as directed by your caregiver.  Elevate your injured ankle above the level of your heart as much as possible for 2-3 days.  If your caregiver recommends crutches, use them as instructed. Gradually put weight on the affected ankle. Continue to use crutches or a cane until you can walk without feeling pain in your ankle.  If you have a plaster splint, wear the splint as directed by your caregiver. Do not rest it on anything harder than a pillow for the first 24 hours. Do not put weight on it. Do not get it wet. You may take it off to take a shower or bath.  You may have been given an elastic bandage to wear around your ankle to provide support. If the elastic bandage is too tight (you have numbness or tingling in your foot or your foot becomes cold and blue), adjust the bandage to make it comfortable.  If you have an air splint, you may blow more air into it or let air out to make it more comfortable. You may take your splint off at night and before taking  a shower or bath. Wiggle your toes in the splint several times per day to decrease swelling. SEEK MEDICAL CARE IF:   You have rapidly increasing bruising or swelling.  Your toes feel extremely cold or you lose feeling in your foot.  Your pain is not relieved with medicine. SEEK IMMEDIATE MEDICAL CARE IF:  Your toes are numb or blue.  You have severe pain that is increasing. MAKE SURE YOU:   Understand these instructions.  Will watch your condition.  Will get help right away if you are not doing well or get worse. Document Released: 09/16/2005 Document Revised: 06/10/2012 Document Reviewed: 09/28/2011 [facility] Patient  Information 2015 [place], [place]. This information is not intended to replace advice given to you by your health care provider. Make sure you discuss any questions you have with your health care provider.  Acute Ankle Sprain with Phase I Rehab An acute ankle sprain is a partial or complete tear in one or more of the ligaments of the ankle due to traumatic injury. The severity of the injury depends on both the number of ligaments sprained and the grade of sprain. There are 3 grades of sprains.   A grade 1 sprain is a mild sprain. There is a slight pull without obvious tearing. There is no loss of strength, and the muscle and ligament are the correct length.  A grade 2 sprain is a moderate sprain. There is tearing of fibers within the substance of the ligament where it connects two bones or two cartilages. The length of the ligament is increased, and there is usually decreased strength.  A grade 3 sprain is a complete rupture of the ligament and is uncommon. In addition to the grade of sprain, there are three types of ankle sprains.  Lateral ankle sprains: This is a sprain of one or more of the three ligaments on the outer side (lateral) of the ankle. These are the most common sprains. Medial ankle sprains: There is one large triangular ligament of the inner side (medial) of the ankle that is susceptible to injury. Medial ankle sprains are less common. Syndesmosis, "high ankle," sprains: The syndesmosis is the ligament that connects the two bones of the lower leg. Syndesmosis sprains usually only occur with very severe ankle sprains. SYMPTOMS  Pain, tenderness, and swelling in the ankle, starting at the side of injury that may progress to the whole ankle and foot with time.  "Pop" or tearing sensation at the time of injury.  Bruising that may spread to the heel.  Impaired ability to walk soon after injury. CAUSES   Acute ankle sprains are caused by trauma placed on the ankle that temporarily  forces or pries the anklebone (talus) out of its normal socket.  Stretching or tearing of the ligaments that normally hold the joint in place (usually due to a twisting injury). RISK INCREASES WITH:  Previous ankle sprain.  Sports in which the foot may land awkwardly (i.e., basketball, volleyball, or soccer) or walking or running on uneven or rough surfaces.  Shoes with inadequate support to prevent sideways motion when stress occurs.  Poor strength and flexibility.  Poor balance skills.  Contact sports. PREVENTION   Warm up and stretch properly before activity.  Maintain physical fitness:  Ankle and leg flexibility, muscle strength, and endurance.  Cardiovascular fitness.  Balance training activities.  Use proper technique and have a coach correct improper technique.  Taping, protective strapping, bracing, or high-top tennis shoes may help prevent injury. Initially, tape is  best; however, it loses most of its support function within 10 to 15 minutes.  Wear proper-fitted protective shoes (High-top shoes with taping or bracing is more effective than either alone).  Provide the ankle with support during sports and practice activities for 12 months following injury. PROGNOSIS   If treated properly, ankle sprains can be expected to recover completely; however, the length of recovery depends on the degree of injury.  A grade 1 sprain usually heals enough in 5 to 7 days to allow modified activity and requires an average of 6 weeks to heal completely.  A grade 2 sprain requires 6 to 10 weeks to heal completely.  A grade 3 sprain requires 12 to 16 weeks to heal.  A syndesmosis sprain often takes more than 3 months to heal. RELATED COMPLICATIONS   Frequent recurrence of symptoms may result in a chronic problem. Appropriately addressing the problem the first time decreases the frequency of recurrence and optimizes healing time. Severity of the initial sprain does not predict the  likelihood of later instability.  Injury to other structures (bone, cartilage, or tendon).  A chronically unstable or arthritic ankle joint is a possibility with repeated sprains. TREATMENT Treatment initially involves the use of ice, medication, and compression bandages to help reduce pain and inflammation. Ankle sprains are usually immobilized in a walking cast or boot to allow for healing. Crutches may be recommended to reduce pressure on the injury. After immobilization, strengthening and stretching exercises may be necessary to regain strength and a full range of motion. Surgery is rarely needed to treat ankle sprains. MEDICATION   Nonsteroidal anti-inflammatory medications, such as aspirin and ibuprofen (do not take for the first 3 days after injury or within 7 days before surgery), or other minor pain relievers, such as acetaminophen, are often recommended. Take these as directed by your caregiver. Contact your caregiver immediately if any bleeding, stomach upset, or signs of an allergic reaction occur from these medications.  Ointments applied to the skin may be helpful.  Pain relievers may be prescribed as necessary by your caregiver. Do not take prescription pain medication for longer than 4 to 7 days. Use only as directed and only as much as you need. HEAT AND COLD  Cold treatment (icing) is used to relieve pain and reduce inflammation for acute and chronic cases. Cold should be applied for 10 to 15 minutes every 2 to 3 hours for inflammation and pain and immediately after any activity that aggravates your symptoms. Use ice packs or an ice massage.  Heat treatment may be used before performing stretching and strengthening activities prescribed by your caregiver. Use a heat pack or a warm soak. SEEK IMMEDIATE MEDICAL CARE IF:   Pain, swelling, or bruising worsens despite treatment.  You experience pain, numbness, discoloration, or coldness in the foot or toes.  New, unexplained  symptoms develop (drugs used in treatment may produce side effects.) EXERCISES  PHASE I EXERCISES RANGE OF MOTION (ROM) AND STRETCHING EXERCISES - Ankle Sprain, Acute Phase I, Weeks 1 to 2 These exercises may help you when beginning to restore flexibility in your ankle. You will likely work on these exercises for the 1 to 2 weeks after your injury. Once your physician, physical therapist, or athletic trainer sees adequate progress, he or she will advance your exercises. While completing these exercises, remember:   Restoring tissue flexibility helps normal motion to return to the joints. This allows healthier, less painful movement and activity.  An effective stretch should be  held for at least 30 seconds.  A stretch should never be painful. You should only feel a gentle lengthening or release in the stretched tissue. RANGE OF MOTION - Dorsi/Plantar Flexion  While sitting with your right / left knee straight, draw the top of your foot upwards by flexing your ankle. Then reverse the motion, pointing your toes downward.  Hold each position for __________ seconds.  After completing your first set of exercises, repeat this exercise with your knee bent. Repeat __________ times. Complete this exercise __________ times per day.  RANGE OF MOTION - Ankle Alphabet  Imagine your right / left big toe is a pen.  Keeping your hip and knee still, write out the entire alphabet with your "pen." Make the letters as large as you can without increasing any discomfort. Repeat __________ times. Complete this exercise __________ times per day.  STRENGTHENING EXERCISES - Ankle Sprain, Acute -Phase I, Weeks 1 to 2 These exercises may help you when beginning to restore strength in your ankle. You will likely work on these exercises for 1 to 2 weeks after your injury. Once your physician, physical therapist, or athletic trainer sees adequate progress, he or she will advance your exercises. While completing these  exercises, remember:   Muscles can gain both the endurance and the strength needed for everyday activities through controlled exercises.  Complete these exercises as instructed by your physician, physical therapist, or athletic trainer. Progress the resistance and repetitions only as guided.  You may experience muscle soreness or fatigue, but the pain or discomfort you are trying to eliminate should never worsen during these exercises. If this pain does worsen, stop and make certain you are following the directions exactly. If the pain is still present after adjustments, discontinue the exercise until you can discuss the trouble with your clinician. STRENGTH - Dorsiflexors  Secure a rubber exercise band/tubing to a fixed object (i.e., table, pole) and loop the other end around your right / left foot.  Sit on the floor facing the fixed object. The band/tubing should be slightly tense when your foot is relaxed.  Slowly draw your foot back toward you using your ankle and toes.  Hold this position for __________ seconds. Slowly release the tension in the band and return your foot to the starting position. Repeat __________ times. Complete this exercise __________ times per day.  STRENGTH - Plantar-flexors   Sit with your right / left leg extended. Holding onto both ends of a rubber exercise band/tubing, loop it around the ball of your foot. Keep a slight tension in the band.  Slowly push your toes away from you, pointing them downward.  Hold this position for __________ seconds. Return slowly, controlling the tension in the band/tubing. Repeat __________ times. Complete this exercise __________ times per day.  STRENGTH - Ankle Eversion  Secure one end of a rubber exercise band/tubing to a fixed object (table, pole). Loop the other end around your foot just before your toes.  Place your fists between your knees. This will focus your strengthening at your ankle.  Drawing the band/tubing  across your opposite foot, slowly, pull your little toe out and up. Make sure the band/tubing is positioned to resist the entire motion.  Hold this position for __________ seconds. Have your muscles resist the band/tubing as it slowly pulls your foot back to the starting position.  Repeat __________ times. Complete this exercise __________ times per day.  STRENGTH - Ankle Inversion  Secure one end of a rubber exercise  band/tubing to a fixed object (table, pole). Loop the other end around your foot just before your toes.  Place your fists between your knees. This will focus your strengthening at your ankle.  Slowly, pull your big toe up and in, making sure the band/tubing is positioned to resist the entire motion.  Hold this position for __________ seconds.  Have your muscles resist the band/tubing as it slowly pulls your foot back to the starting position. Repeat __________ times. Complete this exercises __________ times per day.  STRENGTH - Towel Curls  Sit in a chair positioned on a non-carpeted surface.  Place your right / left foot on a towel, keeping your heel on the floor.  Pull the towel toward your heel by only curling your toes. Keep your heel on the floor.  If instructed by your physician, physical therapist, or athletic trainer, add weight to the end of the towel. Repeat __________ times. Complete this exercise __________ times per day. Document Released: 04/17/2005 Document Revised: 01/31/2014 Document Reviewed: 12/29/2008 [facility] Patient Information 2015 [place], [place]. This information is not intended to replace advice given to you by your health care provider. Make sure you discuss any questions you have with your health care provider.     Emergency Department Resource Guide 1) Find a Doctor and Pay Out of Pocket Although you won't have to find out who is covered by your insurance plan, it is a good idea to ask around and get recommendations. You will then need to  call the office and see if the doctor you have chosen will accept you as a new patient and what types of options they offer for patients who are self-pay. Some doctors offer discounts or will set up payment plans for their patients who do not have insurance, but you will need to ask so you aren't surprised when you get to your appointment.  2) Contact Your Local Health Department Not all health departments have doctors that can see patients for sick visits, but many do, so it is worth a call to see if yours does. If you don't know where your local health department is, you can check in your phone book. The CDC also has a tool to help you locate your state's health department, and many state websites also have listings of all of their local health departments.  3) Find a Walk-in Clinic If your illness is not likely to be very severe or complicated, you may want to try a walk in clinic. These are popping up all over the country in pharmacies, drugstores, and shopping centers. They're usually staffed by nurse practitioners or physician assistants that have been trained to treat common illnesses and complaints. They're usually fairly quick and inexpensive. However, if you have serious medical issues or chronic medical problems, these are probably not your best option.  No Primary Care Doctor: - Call Health Connect at  [phone] - they can help you locate a primary care doctor that  accepts your insurance, provides certain services, etc. - Physician Referral Service- [phone]  Chronic Pain Problems: Organization         Address  Phone   Notes  [facility]  [phone] Patients need to be referred by their primary care doctor.   Medication Assistance: Organization         Address  Phone   Notes  [facility] [address] [place] [place], [place] [address] [phone] --Must be a resident of  [place] -- Must have NO insurance  coverage whatsoever (no Medicaid/ Medicare, etc.) -- The pt. MUST have a primary care doctor that directs their care regularly and follows them in the community   [facility]  [phone]   [employer]  [phone]    Agencies that provide inexpensive medical care: Organization         Address  Phone   Notes  [facility]  [phone]   [facility]    [phone]   [facility] [address] [place], [place] [address] [phone]   [facility] [address] [place] [phone]   [facility]    [phone]   [facility]    [phone]   [facility]  [address], [place] Phone:  [phone], Fax:  [phone] Hours of Operation:  9 am - 6 pm, M-F.  Also accepts Medicaid/Medicare and self-pay.  [facility]  [address], [place] Phone: [phone], Fax: [phone]. Hours of Operation:  8:30 am - 5:30 pm, M-F.  Also accepts Medicaid and self-pay.  [facility] [place] [address] [place] Phone: [phone]   [facility] [address] Natasha Bence [place], [place] [phone], Ext. [phone] Mondays & Thursdays: 7-9 AM.  First 15 patients are seen on a first come, first serve basis.    Medicaid-accepting [facility] Providers:  Organization         Address  Phone   Notes  [facility] [address], [place] [phone] Also accepts self-pay patients.  [facility] [address] Laurell Josephs [place], [place]  [phone]   [facility] [address], [place] [phone]   [facility] [address] [place] [phone]   Renaye Rakers [address], [place]   [phone] Only accepts [place] patients after they have their  name applied to their card.   Self-Pay (no insurance) in [facility]:  Organization         Address  Phone   Notes  Sickle Cell Patients, [facility] [address] [place], [place] [phone]   [facility] [address] [place], [place] [phone]   [facility] [place]  [address], [place] [phone]   [facility]/Dr. Osei-Bonsu  [address], [place] or [address], [place] [phone] Phone number for both [place] and [place] locations is the same.  [facility] [address], [place] [phone]   [facility] [address], [place] or [address] [phone]   [facility]abela [facility] [address], [place] [phone], phone; [phone], fax Sees patients 1st and 3rd Saturday of every month.  Must not qualify for public or private insurance (i.e. Medicaid, Medicare, [place] Health Choice, Veterans' Benefits)  Household income should be no more than 200% of the poverty level The clinic cannot treat you if you are pregnant or think you are pregnant  Sexually transmitted diseases are not treated at the clinic.    Dental Care: Organization         Address  Phone  Notes  [facility] [address] [place], [place] [phone] Accepts children up to age 69 who are enrolled in [place] or [place] Health Choice; pregnant women with a Medicaid card; and children who have applied for Medicaid or [place] Health Choice, but were declined, whose parents can pay a reduced fee at time of service.  [facility] of [facility]  [address], [place] [phone] Accepts children up to age 6 who are enrolled in [place] or [place] Health Choice; pregnant women with a Medicaid card; and children who have applied for  Medicaid or [place] Health Choice, but were declined, whose parents can pay a reduced fee at time of service.  [facility]  [address] [place], [place] [phone] Patients are seen by appointment only. Walk-ins are not accepted. [facility] will see patients 28 years of age and older. Monday - Tuesday (8am-5pm) Most Wednesdays (8:30-5pm) $30 per visit, cash only  [facility]  [address], [facility] [phone] Patients are seen by appointment only. Walk-ins are not accepted. [facility] will see patients 71 years of age and older. One Wednesday Evening (Monthly: Volunteer Based).  $30 per visit, cash only  [employer]  [phone] for adults; Children under age 60, call Graduate Pediatric Dentistry at [phone]. Children aged 9-14, please call [phone] to request a pediatric application.  Dental services are provided in all areas of dental care including fillings, crowns and bridges, complete and partial dentures, implants, gum treatment, root canals, and extractions. Preventive care is also provided. Treatment is provided to both adults and children. Patients are selected via a lottery and there is often a waiting list.   [facility] [address] [place]  [phone] [email or web address]   [facility] [address] [place], [place] [phone], Ext. [phone] Second and Fourth Thursday of each month, opens at 6:30 AM; Clinic ends at 9 AM.  Patients are seen on a first-come first-served basis, and a limited number are seen during each clinic.   [facility]  [address] Ether Griffins [place], [place] [phone]   Eligibility Requirements You must have lived in [place], [place], or [place] counties for at least the last three months.   You cannot be eligible for state or federal sponsored [place], including [employer], [place],  or [employer].   You generally cannot be eligible for healthcare insurance through your employer.    How to apply: Eligibility screenings are held every Tuesday and Wednesday afternoon from 1:00 pm until 4:00 pm. You do not need an appointment for the interview!  [facility] [address] [place], [place] [phone]   [place] [facility] Health Department  [phone]   [facility] Health Department  [phone]   [facility] Health Department  [phone]    Behavioral Health Resources in the Community: Intensive Outpatient Programs Organization         Address  Phone  Notes  [facility] Services [address] [place], [place] [phone]   [facility] Outpatient [address] [place], [place] [phone]   [facility] [address] [place], [place]  [phone]   [facility] [address]  [place], [place] [phone] or [phone]   Substance Abuse Resources Organization  Address  Phone  Notes  [facility]  [phone]   [facility]  [phone]   [facility]  [phone]   Floydene Flock  [phone]   Residential & Outpatient Substance Abuse Program  [phone]   Psychological Services Organization         Address  Phone  Notes  [facility] Behavioral Health  [phone]   [facility] Services  [phone]   [facility] Mental Health [address], [place] [phone] or [phone]    Mobile Crisis Teams Organization         Address  Phone  Notes  [facility], Mobile Crisis Care Unit  [phone]   [facility]  [address] [place], [place] [phone]   Doristine Locks [address] [place] [phone]    Self-Help/Support Groups Organization         Address  Phone             Notes  [facility] - variety of support groups   [phone]- [phone] Call for more information  Narcotics Anonymous (NA), [facility] [address], [employer] [place]  2 meetings at this location   Statistician         Address  Phone  Notes  [facility] [address],    [place]  [phone]   [facility]  [address] [place] [address], [place], [place] [phone]   [facility] [address] [place], [place] [phone] Admissions: 8am-3pm M-F  [facility] [address]    [place], [place] [phone]   [facility] [address] [place], [place], [place] [phone]   [facility] [address]  [place], [place] [phone]   [facility] - Intensive Outpatient [address], [place], [place] [phone]   [facility] ([facility] [address]  [place], [place] [phone] or 9373953842   [facility] ([facility]) [address], [place], [place] [phone] Accepts Medicaid  [facility] [address]  [place] [phone] Substance Abuse/Addiction Treatment   [facility] Organization         Address  Phone  Notes  [facility]  [phone]   Angie Fava, PhD [address] [place], [place]   [phone] or 8303233997   [facility] Behavioral   [address] [place], [place] [phone]   [facility] [address], [place], [place] [phone] Insurance/Medicaid/sponsorship through [facility] [address]                                    [place], [place] [phone] Therapy/tele-psych/case  [facility] [address], [place] [phone]    Dr. Lolly Mustache  [phone]   [facility]  [employer] [facility] Dept. 1) [address] [place] 2) [address] [place] 3)  [address], [place] [phone]  [phone]   [facility] Child Abuse Hotline 770-529-3873 or [phone] (After Hours)

## 2015-05-28 NOTE — ED Provider Notes (Signed)
CSN: [id number]   Arrival date & time 05/28/15 4098  History  This chart was scribed for non-physician practitioner, Trixie Dredge PA-C, working with Marily Memos, MD by Bethel Born, ED Scribe. This patient was seen in room TR06C/TR06C and the patient's care was started at 10:11 AM.  Chief Complaint  Patient presents with  . Ankle Pain    HPI The history is provided by the patient. No language interpreter was used.   Laurie Khan is a 34 y.o. female who presents to the Emergency Department complaining of ongoing right ankle pain with sudden onset 05/14/15 after a misstep, rolling ankle while stepping into a hole. She was seen on 05/14/15 and had a negative Xray. The pain is described as aching and rated 5/10 in severity. Associated symptoms include intermittent swelling that is worse later in the day and decreased ROM and pain with dorsiflexion. Pt denies numbness or weakness in the toes.  Denies new injury.  Denies foot or calf/shin pain.  Using ASO and crutches she was given at last visit.  Was given ortho follow up but she is unable because she has no insurance.   Past Medical History  Diagnosis Date  . Skin rash     under breast ,leg and face    Past Surgical History  Procedure Laterality Date  . Cholecystectomy      History reviewed. No pertinent family history.  Social History  Substance Use Topics  . Smoking status: Current Every Day Smoker  . Smokeless tobacco: Never Used  . Alcohol Use: Yes     Comment: occ     Review of Systems  Constitutional: Negative for fever and chills.  Cardiovascular: Positive for leg swelling (intermittent ankle swelling).  Musculoskeletal: Positive for arthralgias. Negative for gait problem.       Right ankle pain and swelling  Skin: Negative for color change, rash and wound.  Allergic/Immunologic: Negative for immunocompromised state.  Neurological: Negative for weakness.  Hematological: Does not bruise/bleed easily.   Psychiatric/Behavioral: Negative for self-injury (accidental).    Home Medications   Prior to Admission medications   Medication Sig Start Date End Date Taking? Authorizing Provider  amoxicillin (AMOXIL) 500 MG capsule Take 1 capsule (500 mg total) by mouth 3 (three) times daily. 03/04/13   Renne Crigler, PA-C  benzonatate (TESSALON) 100 MG capsule Take 1 capsule (100 mg total) by mouth every 8 (eight) hours. 03/04/13   Renne Crigler, PA-C    Allergies  Review of patient's allergies indicates no known allergies.  Triage Vitals: Wt 160 lb (72.576 kg)  LMP 05/12/2015 (Exact Date)  Physical Exam  Constitutional: She appears well-developed and well-nourished. No distress.  HENT:  Head: Normocephalic and atraumatic.  Neck: Neck supple.  Pulmonary/Chest: Effort normal.  Musculoskeletal:  Right foot non tender with full active ROM of all digits Distal sensation intact Capillary refill less than 2 seconds DP/PT pulses intact Tenderness around the lateral malleolus Achilles intact No calf tenderness or other tenderness at the lower leg Pain exacerbated with plantar flexion of foot and pain is located anterior to the lateral malleolus  Neurological: She is alert.  Skin: She is not diaphoretic.  Nursing note and vitals reviewed.   ED Course  Procedures  DIAGNOSTIC STUDIES: Oxygen Saturation is 99% on RA,  normal by my interpretation.    COORDINATION OF CARE: 10:17 AM Discussed treatment plan which includes new xray with pt at bedside and pt agreed to plan.  Labs Review- Labs Reviewed - No data  to display  Imaging Review Dg Ankle Complete Right  05/28/2015   CLINICAL DATA:  Injury up ankle 2 weeks ago with swelling and pain of right ankle.  EXAM: RIGHT ANKLE - COMPLETE 3+ VIEW  COMPARISON:  May 14, 2015  FINDINGS: There is no evidence of fracture, dislocation, or joint effusion. There is no evidence of arthropathy or other focal bone abnormality. Soft tissues are unremarkable.   IMPRESSION: Negative.   Electronically Signed   By: Sherian Rein M.D.   On: 05/28/2015 10:57    I, Trixie Dredge PA-C, personally reviewed and evaluated these images and lab results as part of my medical decision-making.   EKG Interpretation None      MDM   Final diagnoses:  Right ankle sprain, subsequent encounter  Right ankle pain   Afebrile, nontoxic patient with injury to her right ankle while stepping into hole and inverting foot.   Xray performed during initial visit and again today are both negative.  Suspect sprain.  Neurovascularly intact.   D/C home with ASO, crutches (pt has both), follow up as previously instructed.  Discussed result, findings, treatment, and follow up  with patient.  Pt given return precautions.  Pt verbalizes understanding and agrees with plan.       I personally performed the services described in this documentation, which was scribed in my presence. The recorded information has been reviewed and is accurate.     Trixie Dredge, PA-C 05/28/15 1252  Marily Memos, MD 05/28/15 [phone]

## 2015-05-28 NOTE — ED Notes (Signed)
PT reports on going pain and decreased ROM to RT ankle after injury 05-14-15 . Pt presents with ASO brace from last injury. Pt unable to follow up with Ortho referral at this time due to lack of insurance.

## 2015-05-28 NOTE — ED Notes (Signed)
Declined W/C at D/C and was escorted to lobby by RN. 

## 2015-09-15 IMAGING — CR DG ANKLE COMPLETE 3+V*R*
3 series · 3 of 3 positions shown · non-contrast
Comparison: None.

CLINICAL DATA: Recent fall with twisting injury to the ankle,
initial encounter

EXAM:
RIGHT ANKLE - COMPLETE 3+ VIEW

[ankle ap]
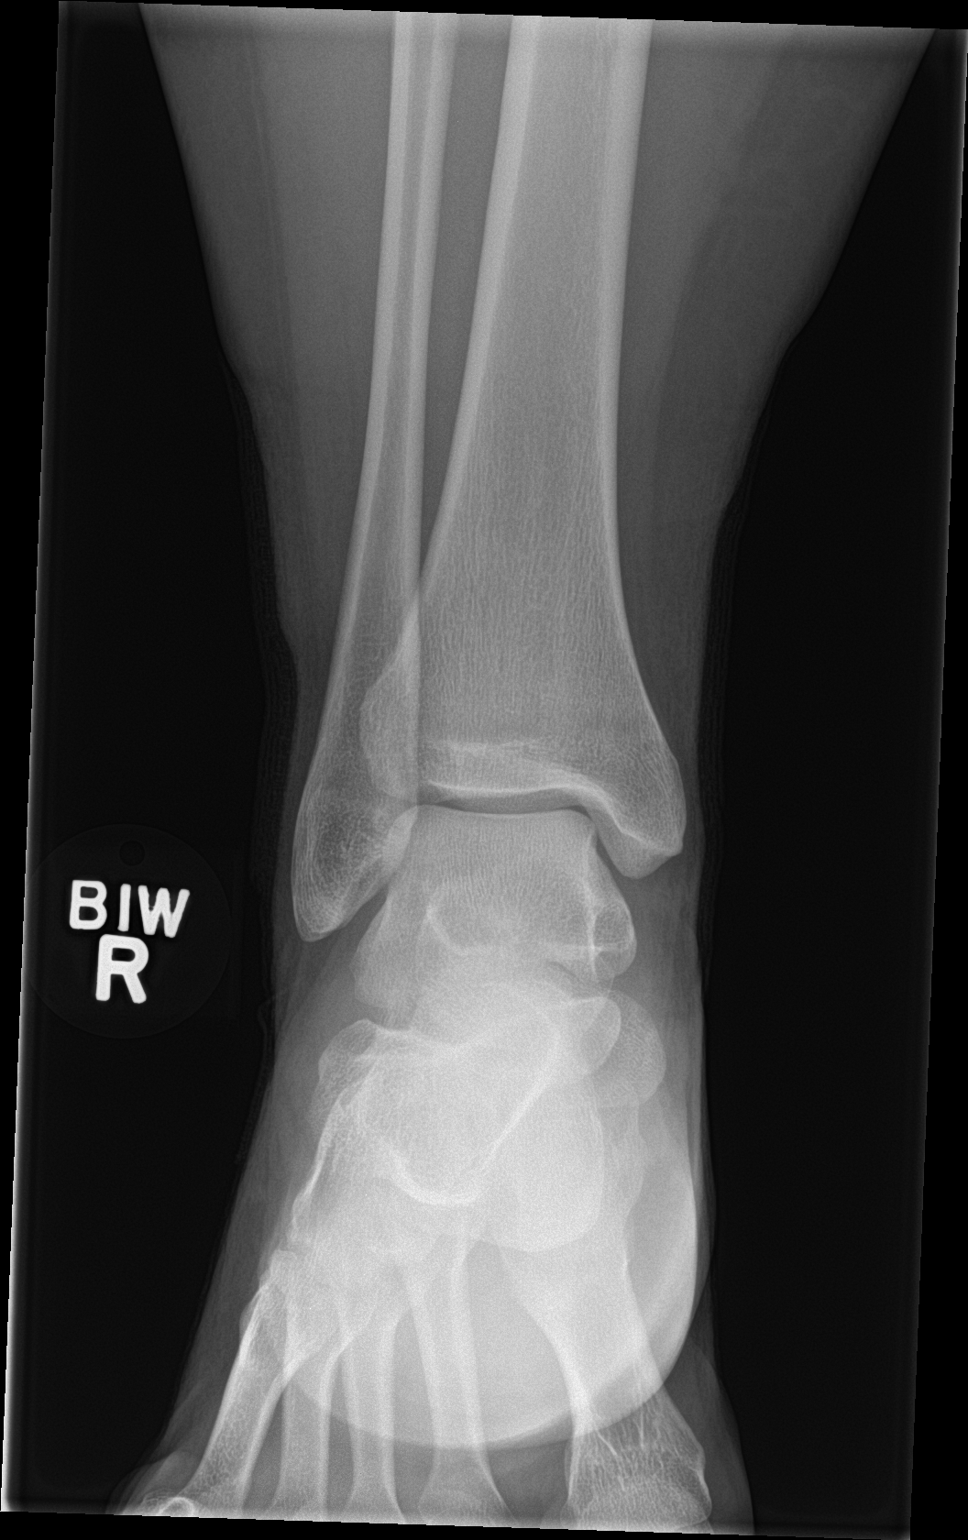

[ankle obl]
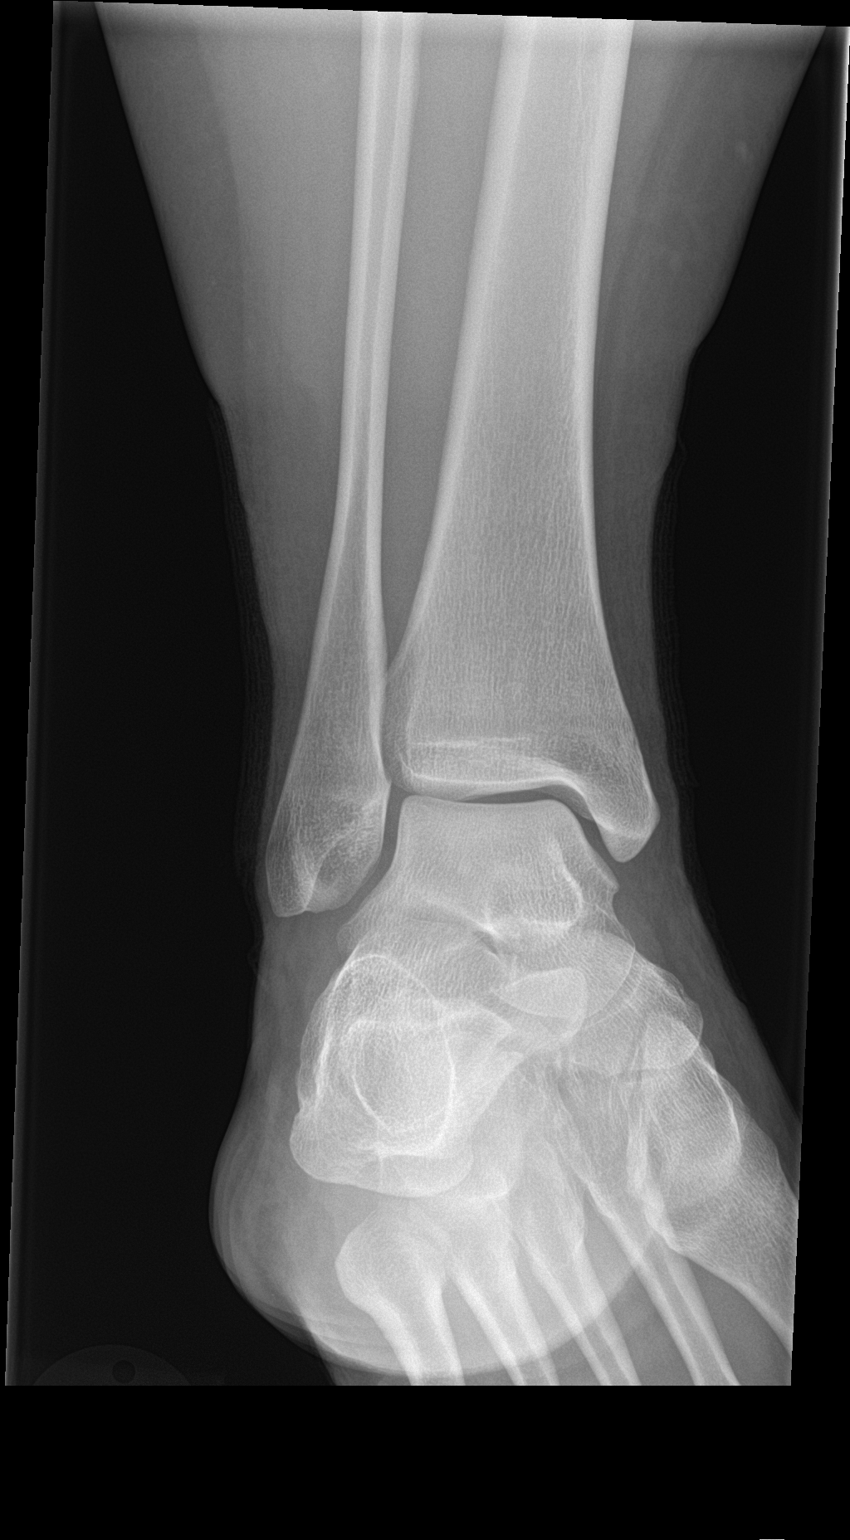

[ankle lat]
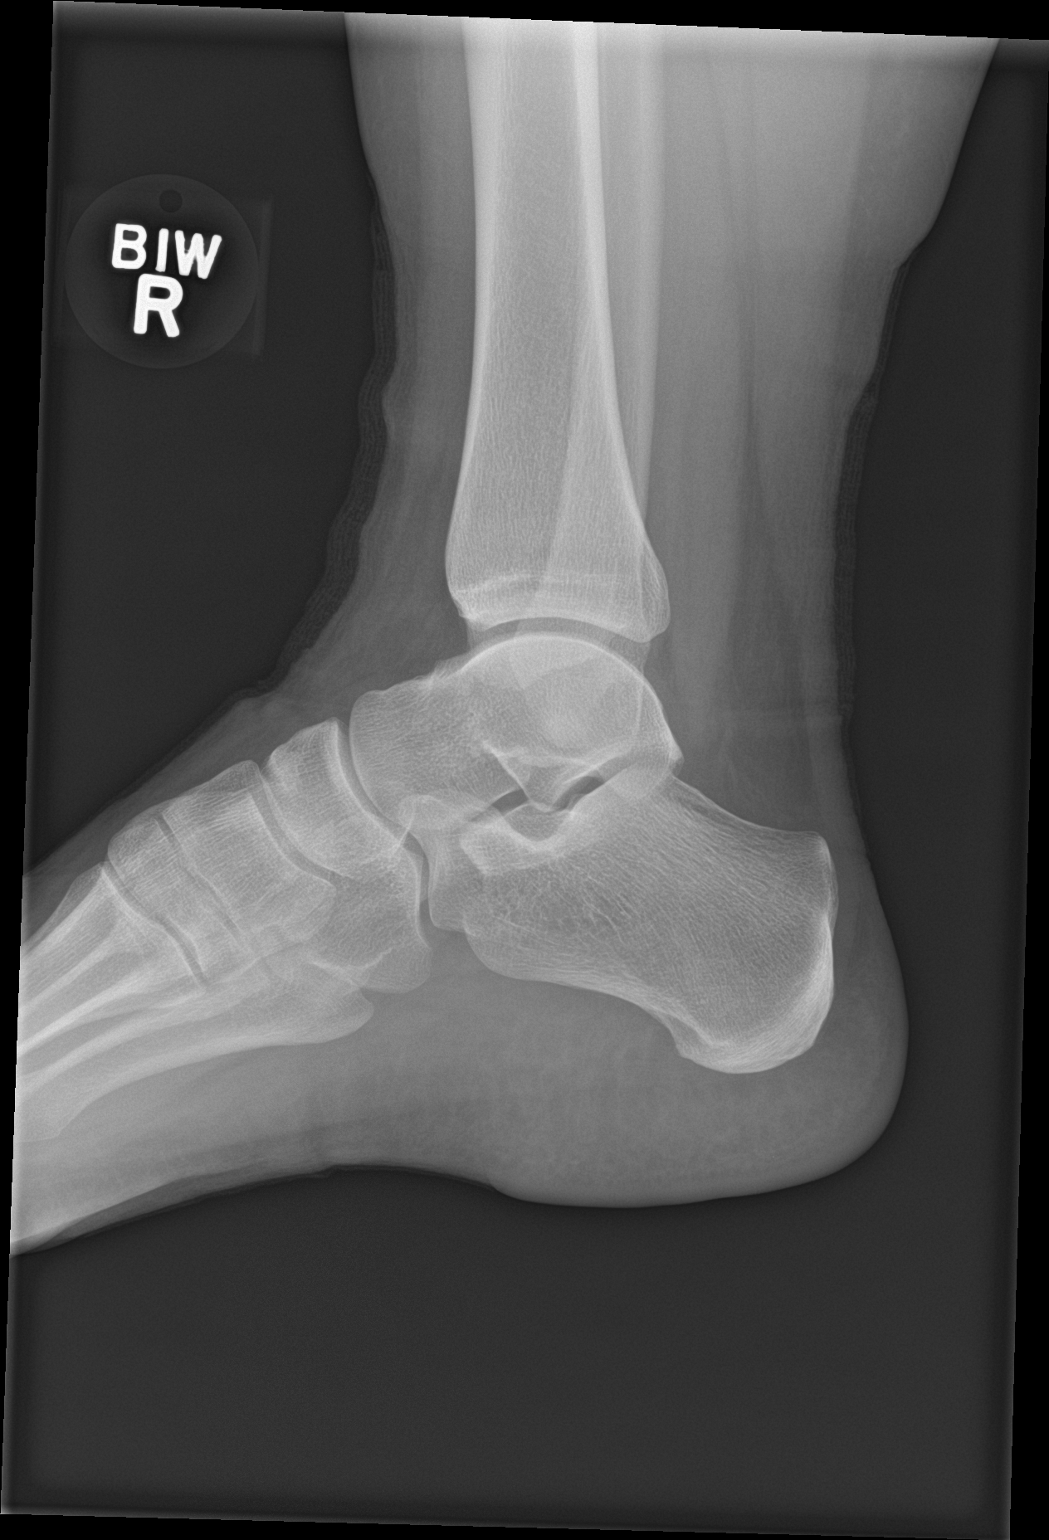

[3 of 3 positions shown; findings below may reference images not displayed]

FINDINGS: There is no evidence of fracture, dislocation, or joint effusion.
There is no evidence of arthropathy or other focal bone abnormality.
Soft tissues are unremarkable.
IMPRESSION: No acute abnormality noted.

## 2015-09-29 IMAGING — CR DG ANKLE COMPLETE 3+V*R*
3 series · 3 of 3 positions shown · non-contrast
Comparison: May 14, 2015

CLINICAL DATA: Injury up ankle 2 weeks ago with swelling and pain
of right ankle.

EXAM:
RIGHT ANKLE - COMPLETE 3+ VIEW

[ankle ap]
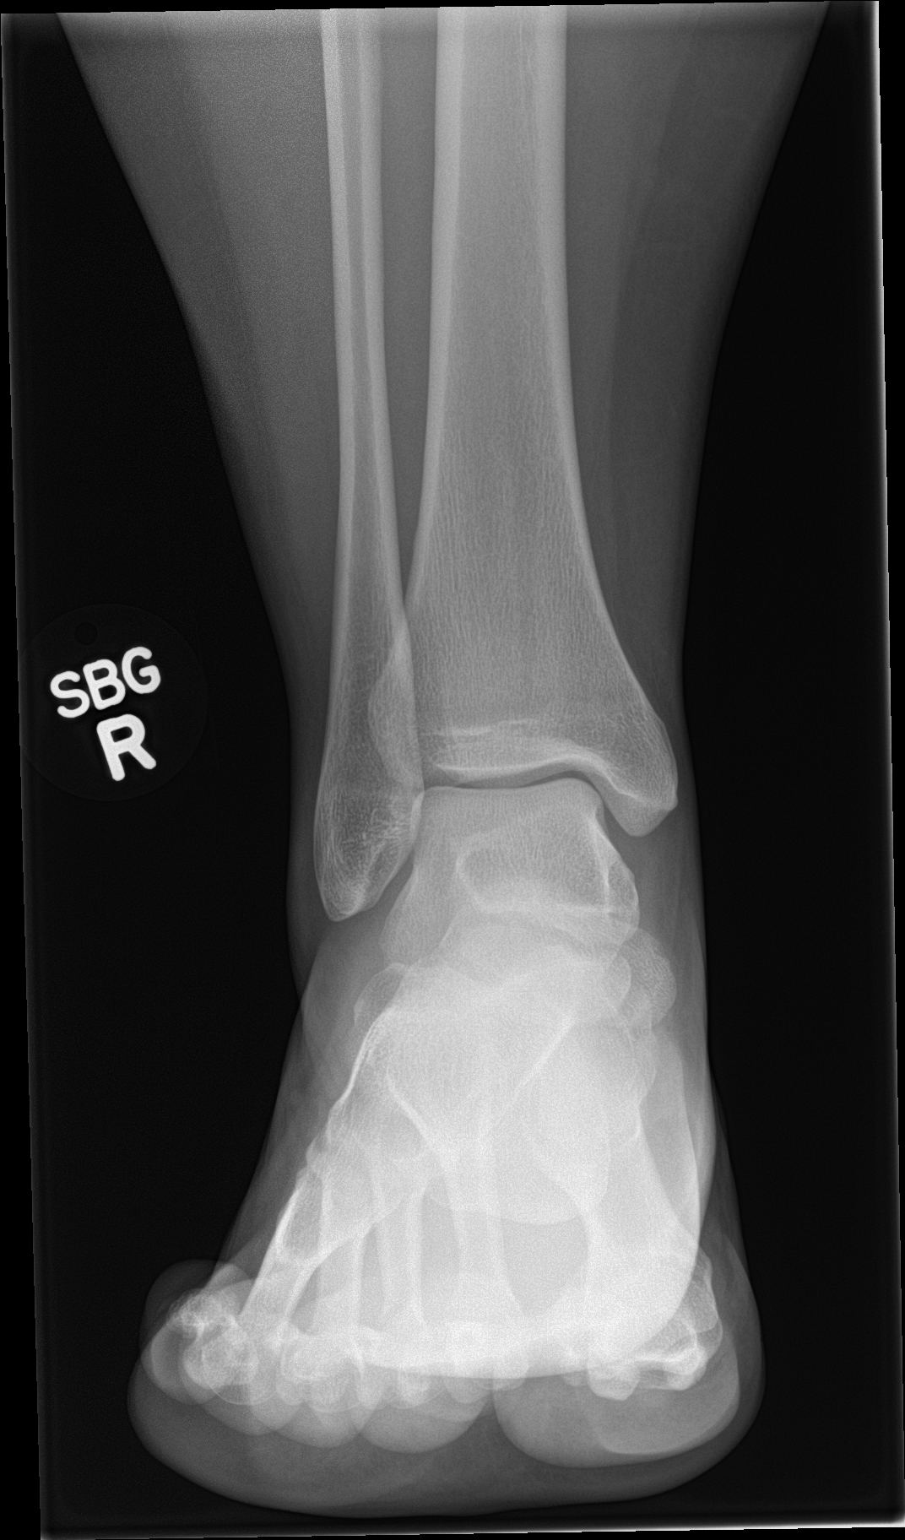

[ankle obl]
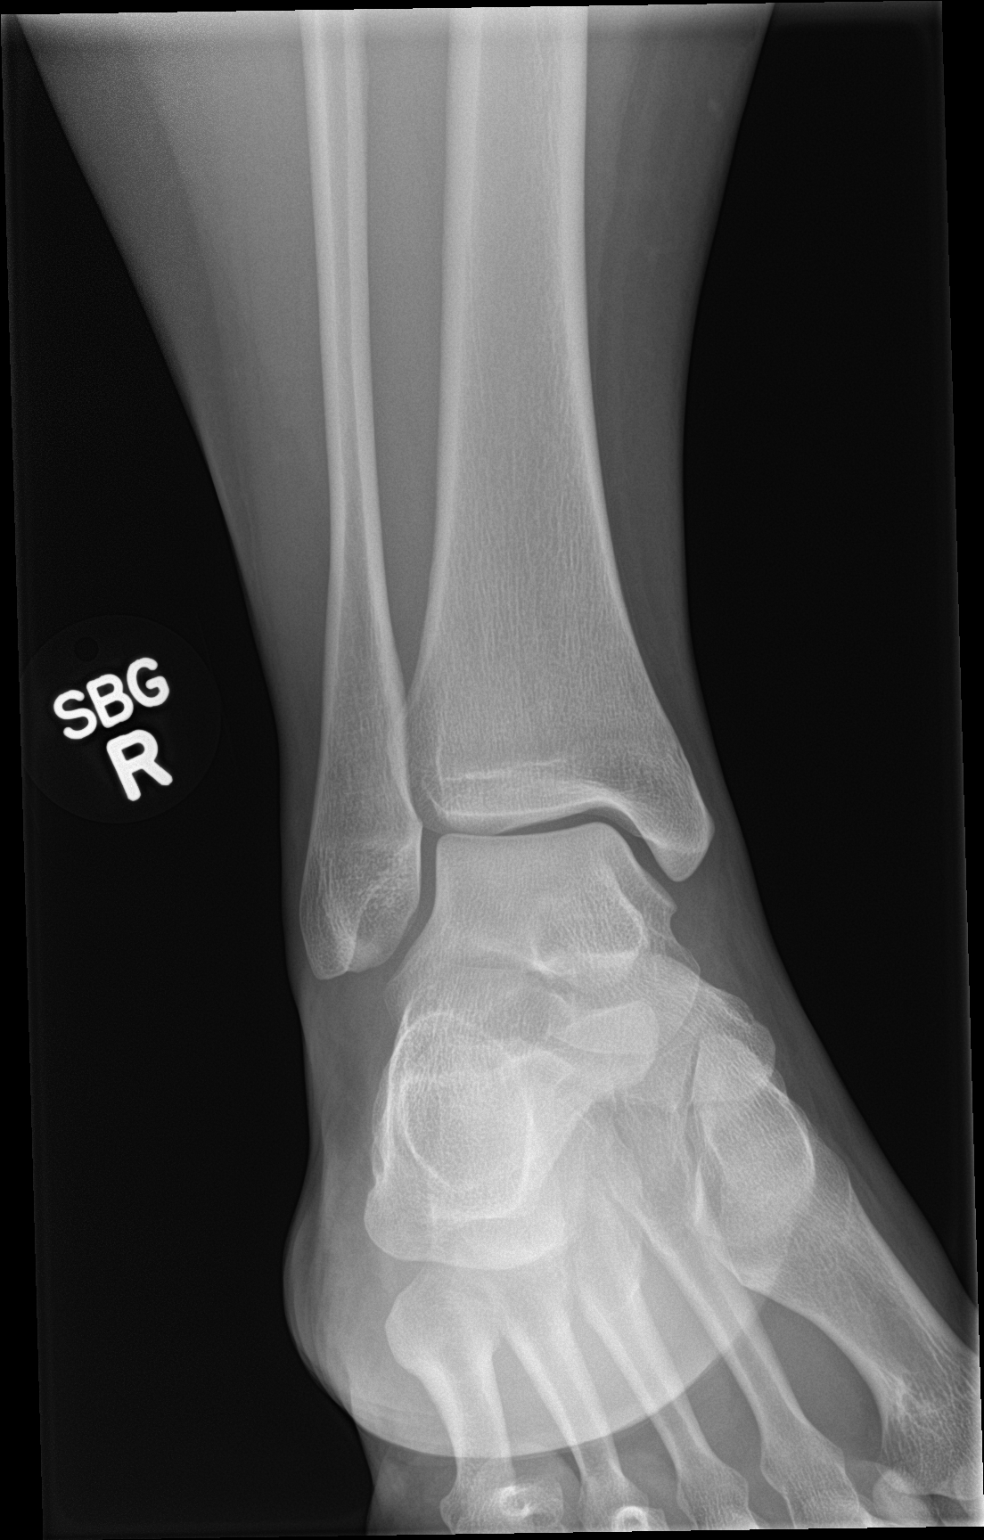

[ankle lat]
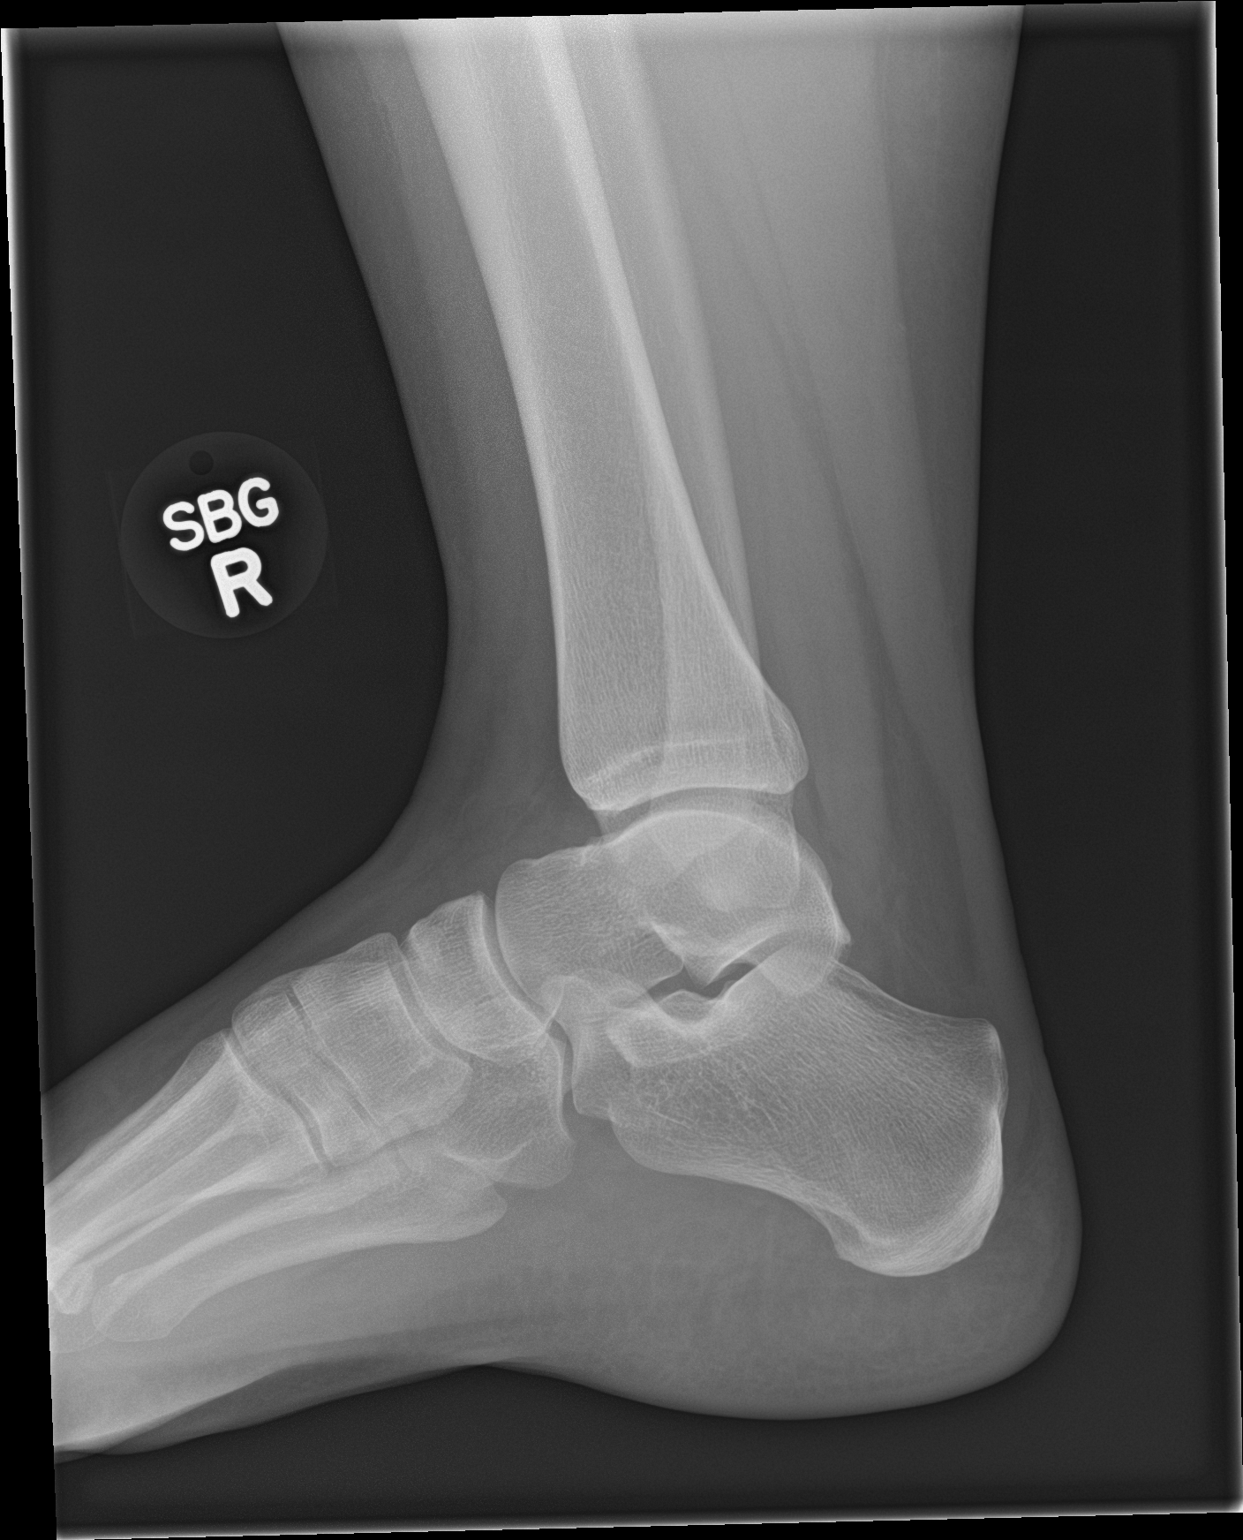

[3 of 3 positions shown; findings below may reference images not displayed]

FINDINGS: There is no evidence of fracture, dislocation, or joint effusion.
There is no evidence of arthropathy or other focal bone abnormality.
Soft tissues are unremarkable.
IMPRESSION: Negative.
